# Patient Record
Sex: Female | Born: 2000 | Race: White | Hispanic: Yes | Marital: Single | Smoking: Current every day smoker
Health system: Southern US, Community
[De-identification: ages and names within clinical notes are randomized; demographics above are authoritative.]

## PROBLEM LIST (undated history)

## (undated) DIAGNOSIS — F319 Bipolar disorder, unspecified: Secondary | ICD-10-CM

---

## 2021-08-11 ENCOUNTER — Ambulatory Visit (INDEPENDENT_AMBULATORY_CARE_PROVIDER_SITE_OTHER): Payer: Medicaid Other

## 2021-08-11 ENCOUNTER — Ambulatory Visit
Admission: EM | Admit: 2021-08-11 | Discharge: 2021-08-11 | Disposition: A | Discharge status: Home / Self Care | Payer: Medicaid Other | Attending: Internal Medicine | Admitting: Internal Medicine

## 2021-08-11 ENCOUNTER — Encounter (HOSPITAL_COMMUNITY): Payer: Self-pay

## 2021-08-11 ENCOUNTER — Other Ambulatory Visit: Payer: Self-pay

## 2021-08-11 ENCOUNTER — Encounter: Payer: Self-pay | Admitting: Emergency Medicine

## 2021-08-11 ENCOUNTER — Emergency Department (HOSPITAL_COMMUNITY)
Admission: EM | Admit: 2021-08-11 | Discharge: 2021-08-11 | Disposition: A | Discharge status: Home / Self Care | Payer: Medicaid Other | Attending: Emergency Medicine | Admitting: Emergency Medicine

## 2021-08-11 DIAGNOSIS — M25571 Pain in right ankle and joints of right foot: Secondary | ICD-10-CM | POA: Diagnosis not present

## 2021-08-11 DIAGNOSIS — S92111A Displaced fracture of neck of right talus, initial encounter for closed fracture: Secondary | ICD-10-CM | POA: Diagnosis not present

## 2021-08-11 DIAGNOSIS — M79671 Pain in right foot: Secondary | ICD-10-CM | POA: Diagnosis not present

## 2021-08-11 DIAGNOSIS — Y990 Civilian activity done for income or pay: Secondary | ICD-10-CM | POA: Insufficient documentation

## 2021-08-11 DIAGNOSIS — S92101A Unspecified fracture of right talus, initial encounter for closed fracture: Secondary | ICD-10-CM | POA: Diagnosis not present

## 2021-08-11 DIAGNOSIS — F17210 Nicotine dependence, cigarettes, uncomplicated: Secondary | ICD-10-CM | POA: Insufficient documentation

## 2021-08-11 DIAGNOSIS — W228XXA Striking against or struck by other objects, initial encounter: Secondary | ICD-10-CM | POA: Diagnosis not present

## 2021-08-11 DIAGNOSIS — S99911A Unspecified injury of right ankle, initial encounter: Secondary | ICD-10-CM | POA: Diagnosis present

## 2021-08-11 DIAGNOSIS — S82891A Other fracture of right lower leg, initial encounter for closed fracture: Secondary | ICD-10-CM

## 2021-08-11 MED ORDER — IBUPROFEN 800 MG PO TABS: 800.0000 mg | ORAL_TABLET | Freq: Three times a day (TID) | ORAL | 0 refills | Status: DC

## 2021-08-11 NOTE — ED Provider Notes (Signed)
[facility] EMERGENCY DEPARTMENT Provider Note   CSN: [id number] Arrival date & time: 08/11/21  1316     History No chief complaint on file.   Katrina Shaw is a 20 y.o. female.  HPI  Patient presents with right ankle fracture.  This happened 4 days ago acutely, a flower cart rolled over her foot and caused significant pain.  Pain itself is been constant, not improving.  Was seen in urgent care earlier today and they advised to go to the ED for CT scan to better evaluate the fracture.  She is wearing a boot and using crutches which helped alleviate the pain.  Bearing weight on the foot is an aggravating factor.  No prior injuries to the extremity.  History reviewed. No pertinent past medical history.  There are no problems to display for this patient.   History reviewed. No pertinent surgical history.   OB History   No obstetric history on file.     No family history on file.  Social History   Tobacco Use   Smoking status: Every Day    Types: Cigarettes   Smokeless tobacco: Never    Home Medications Prior to Admission medications   Medication Sig Start Date End Date Taking? Authorizing Provider  ibuprofen (ADVIL) 800 MG tablet Take 1 tablet (800 mg total) by mouth 3 (three) times daily. 08/11/21  Yes Theron Arista, PA-C    Allergies    Patient has no known allergies.  Review of Systems   Review of Systems  Constitutional:  Negative for fever.  Musculoskeletal:  Positive for gait problem and myalgias.   Physical Exam Updated Vital Signs BP 112/89   Pulse 100   Temp 98 F (36.7 C) (Oral)   Resp 14   SpO2 98%   Physical Exam Vitals and nursing note reviewed. Exam conducted with a chaperone present.  Constitutional:      General: She is not in acute distress.    Appearance: Normal appearance.  HENT:     Head: Normocephalic and atraumatic.  Eyes:     General: No scleral icterus.    Extraocular Movements: Extraocular movements  intact.     Pupils: Pupils are equal, round, and reactive to light.  Cardiovascular:     Pulses: Normal pulses.     Comments: Radial pulse DP and PT 2+ Musculoskeletal:        General: Swelling, tenderness and signs of injury present.     Comments: Decreased range of motion to the right ankle secondary to pain.  Good range of motion to the toes, knee.  Compartment is soft.  Skin:    Capillary Refill: Capillary refill takes less than 2 seconds.     Coloration: Skin is not jaundiced.  Neurological:     Mental Status: She is alert. Mental status is at baseline.     Coordination: Coordination normal.     Comments: Sensation to light touch is grossly intact   ED Results / Procedures / Treatments   Labs (all labs ordered are listed, but only abnormal results are displayed) Labs Reviewed - No data to display  EKG None  Radiology DG Ankle Complete Right  Result Date: 08/11/2021 CLINICAL DATA:  Foot and ankle rolled over by large cart by report. EXAM: RIGHT ANKLE - COMPLETE 3+ VIEW COMPARISON:  Foot evaluation of the same date. FINDINGS: Soft tissue swelling centered more about the hindfoot Small bone fragment along the anterior talus. Lateral view is with mildly oblique projection. On  this view there is linear density that passes through the talar neck. IMPRESSION: Question mildly displaced fracture of the talar neck associated with small avulsion injury as well. Lateral view limited by some obliquity. CT may be helpful for further assessment. Extensive soft tissue swelling over the hindfoot. Electronically Signed   By: Donzetta Kohut M.D.   On: 08/11/2021 11:40   DG Foot Complete Right  Result Date: 08/11/2021 CLINICAL DATA:  A 20 year old female presents for evaluation of foot pain that began on Friday after injury to the RIGHT foot and ankle. EXAM: RIGHT FOOT COMPLETE - 3+ VIEW COMPARISON:  Ankle evaluation of the same date. FINDINGS: Some soft tissue swelling over the subtle soft tissue  swelling over the anterior talus on the lateral view and small calcific density better seen on the ankle evaluation of the same date. No additional signs of fracture on the foot radiograph. IMPRESSION: Soft tissue swelling with possible avulsion fracture over the anterior talus. Also with some added density through the talar neck suggested more on the ankle than on the foot radiograph, see ankle radiograph for further detail. Electronically Signed   By: Donzetta Kohut M.D.   On: 08/11/2021 11:34    Procedures Procedures   Medications Ordered in ED Medications - No data to display  ED Course  I have reviewed the triage vital signs and the nursing notes.  Pertinent labs & imaging results that were available during my care of the patient were reviewed by me and considered in my medical decision making (see chart for details).    MDM Rules/Calculators/A&P                           Soft compartments, neurovascularly intact.  Good cap refill, strong pulses.  She is able to flex and extend the toes, no tenderness to the anterior lower extremity.  Do not suspect compartment syndrome, no vascular compromise.  Do not think CT is warranted based on the physical exam.  I also independently reviewed the x-ray results which show a fracture, patient will need orthopedic follow-up.  She is already been given boot and crutches, will give orthopedic referral as well as anti-inflammatory medicine.  Patient discharged in stable condition.  Final Clinical Impression(s) / ED Diagnoses Final diagnoses:  Closed fracture of right ankle, initial encounter    Rx / DC Orders ED Discharge Orders          Ordered    ibuprofen (ADVIL) 800 MG tablet  3 times daily        08/11/21 1500             Theron Arista, [place] 08/11/21 1742    Ernie Avena, MD 08/11/21 1859

## 2021-08-11 NOTE — ED Provider Notes (Signed)
[facility]    CSN: [id number] Arrival date & time: 08/11/21  9937      History   Chief Complaint Chief Complaint  Patient presents with   Foot Injury    HPI Katrina Shaw is a 20 y.o. female.   Patient presents with right ankle and foot injury that occurred approximately 4 days ago.  Patient reports that a cart full of plants rolled over her ankle and foot and did not immediately move off.  Patient has been having swelling and pain in the right foot and ankle since injury occurred.  Patient having difficulty with range of motion of toes and ankle.  Denies any numbness or tingling to the area.  Patient is able to bear weight but with difficulty.   Foot Injury  History reviewed. No pertinent past medical history.  There are no problems to display for this patient.   History reviewed. No pertinent surgical history.  OB History   No obstetric history on file.      Home Medications    Prior to Admission medications   Not on File    Family History History reviewed. No pertinent family history.  Social History Social History   Tobacco Use   Smoking status: Every Day    Types: Cigarettes   Smokeless tobacco: Never     Allergies   Patient has no known allergies.   Review of Systems Review of Systems Per HPI  Physical Exam Triage Vital Signs ED Triage Vitals  Enc Vitals Group     BP 08/11/21 1059 (!) 146/84     Pulse Rate 08/11/21 1059 (!) 133     Resp 08/11/21 1059 16     Temp 08/11/21 1059 97.9 F (36.6 C)     Temp Source 08/11/21 1059 Oral     SpO2 08/11/21 1059 96 %     Weight --      Height --      Head Circumference --      Peak Flow --      Pain Score 08/11/21 1107 5     Pain Loc --      Pain Edu? --      Excl. in GC? --    No data found.  Updated Vital Signs BP (!) 146/84 (BP Location: Left Arm)   Pulse (!) 133   Temp 97.9 F (36.6 C) (Oral)   Resp 16   SpO2 96%   Visual Acuity Right Eye Distance:    Left Eye Distance:   Bilateral Distance:    Right Eye Near:   Left Eye Near:    Bilateral Near:     Physical Exam Constitutional:      General: She is not in acute distress.    Appearance: Normal appearance. She is not toxic-appearing or diaphoretic.  HENT:     Head: Normocephalic and atraumatic.  Eyes:     Extraocular Movements: Extraocular movements intact.     Conjunctiva/sclera: Conjunctivae normal.  Pulmonary:     Effort: Pulmonary effort is normal.  Musculoskeletal:     Right ankle: Swelling present. No deformity. Tenderness present. No lateral malleolus tenderness. Decreased range of motion. Anterior drawer test negative. Normal pulse.     Right Achilles Tendon: Normal.     Left ankle: Normal.     Right foot: Normal capillary refill. Swelling and tenderness present. No deformity, bony tenderness or crepitus. Normal pulse.     Left foot: Normal.       Feet:  Comments: Moderate swelling noted to right lateral ankle.  Erythema and mild swelling noted to left medial foot.  Neurovascular intact.  Patient is able to wiggle toes but is having difficulty with range of motion of toes.  Decreased range of motion of right ankle as well.  Neurological:     General: No focal deficit present.     Mental Status: She is alert and oriented to person, place, and time. Mental status is at baseline.  Psychiatric:        Mood and Affect: Mood normal.        Behavior: Behavior normal.        Thought Content: Thought content normal.        Judgment: Judgment normal.     UC Treatments / Results  Labs (all labs ordered are listed, but only abnormal results are displayed) Labs Reviewed - No data to display  EKG   Radiology DG Ankle Complete Right  Result Date: 08/11/2021 CLINICAL DATA:  Foot and ankle rolled over by large cart by report. EXAM: RIGHT ANKLE - COMPLETE 3+ VIEW COMPARISON:  Foot evaluation of the same date. FINDINGS: Soft tissue swelling centered more about the  hindfoot Small bone fragment along the anterior talus. Lateral view is with mildly oblique projection. On this view there is linear density that passes through the talar neck. IMPRESSION: Question mildly displaced fracture of the talar neck associated with small avulsion injury as well. Lateral view limited by some obliquity. CT may be helpful for further assessment. Extensive soft tissue swelling over the hindfoot. Electronically Signed   By: Donzetta Kohut M.D.   On: 08/11/2021 11:40   DG Foot Complete Right  Result Date: 08/11/2021 CLINICAL DATA:  A 20 year old female presents for evaluation of foot pain that began on Friday after injury to the RIGHT foot and ankle. EXAM: RIGHT FOOT COMPLETE - 3+ VIEW COMPARISON:  Ankle evaluation of the same date. FINDINGS: Some soft tissue swelling over the subtle soft tissue swelling over the anterior talus on the lateral view and small calcific density better seen on the ankle evaluation of the same date. No additional signs of fracture on the foot radiograph. IMPRESSION: Soft tissue swelling with possible avulsion fracture over the anterior talus. Also with some added density through the talar neck suggested more on the ankle than on the foot radiograph, see ankle radiograph for further detail. Electronically Signed   By: Donzetta Kohut M.D.   On: 08/11/2021 11:34    Procedures Procedures (including critical care time)  Medications Ordered in UC Medications - No data to display  Initial Impression / Assessment and Plan / UC Course  I have reviewed the triage vital signs and the nursing notes.  Pertinent labs & imaging results that were available during my care of the patient were reviewed by me and considered in my medical decision making (see chart for details).     It appears that patient has an avulsion fracture of the talar neck of the right lower extremity.  Radiologist suggested CT scan for further evaluation given limited view.  Nonweightbearing  boot applied in urgent care and crutches were supplied for nonweightbearing.  No splinting supplies available in urgent care.  Provider at hospital may change splint if needed based on further imaging.  Patient advised to go to the hospital for CT scan and further evaluation.  Patient was agreeable with plan.  She will need to follow-up with orthopedist in the next few days if no other abnormalities  found at the hospital.  Provided patient with contact information for orthopedist.  Discussed supportive care and ice application with patient.  Vital signs stable at discharge.  Agree with patient's family member transporting her to the hospital. Final Clinical Impressions(s) / UC Diagnoses   Final diagnoses:  Closed displaced fracture of neck of right talus, initial encounter     Discharge Instructions      Please go to the emergency department as soon as you leave urgent care for further evaluation and management of your right ankle fracture.  It is suggested that you get a CT scan for further evaluation.  You will need to follow-up with orthopedist in the next few days unless otherwise advised by emergency department for further evaluation and management.  No weightbearing until otherwise advised.    ED Prescriptions   None    PDMP not reviewed this encounter.   Gustavus Bryant, [place] 08/11/21 1221

## 2021-08-11 NOTE — Discharge Instructions (Addendum)
Take 800 mg of ibuprofen 3 times daily.  Take it with food and water when you can as anti-inflammatory medicine irritates his stomach lining.  Continue wearing the boot and using the crutches, do not bear weight on the ankle.  Call the orthopedic office today to set up follow-up this week.

## 2021-08-11 NOTE — Discharge Instructions (Signed)
Please go to the emergency department as soon as you leave urgent care for further evaluation and management of your right ankle fracture.  It is suggested that you get a CT scan for further evaluation.  You will need to follow-up with orthopedist in the next few days unless otherwise advised by emergency department for further evaluation and management.  No weightbearing until otherwise advised.

## 2021-08-11 NOTE — ED Triage Notes (Signed)
Was rolling a large cart of plants at work on Friday and rolled over right ankle/foot, it was then stuck on the foot and not immediately moved off. Now having swelling and pain in the right foot with difficulty moving toes, foot, and ankle.

## 2021-08-11 NOTE — ED Triage Notes (Signed)
Patient from urgent care following accident at work on Friday. States that her foot was run over by piece of equipment. Patient was told she has fracture

## 2021-11-14 ENCOUNTER — Ambulatory Visit (HOSPITAL_COMMUNITY)
Admission: EM | Admit: 2021-11-14 | Discharge: 2021-11-14 | Disposition: A | Discharge status: Home / Self Care | Payer: Medicaid Other | Attending: Urology | Admitting: Urology

## 2021-11-14 DIAGNOSIS — R45851 Suicidal ideations: Secondary | ICD-10-CM | POA: Insufficient documentation

## 2021-11-14 DIAGNOSIS — F332 Major depressive disorder, recurrent severe without psychotic features: Secondary | ICD-10-CM

## 2021-11-14 DIAGNOSIS — F319 Bipolar disorder, unspecified: Secondary | ICD-10-CM | POA: Insufficient documentation

## 2021-11-14 NOTE — ED Triage Notes (Addendum)
Pt presents to [facility] requesting resources for a psychiatrist. Pt states that she was diagnosed with Bipolar and depression and is not presribed any medication at this time. Pt states that she had thoughts about wanting to kill herself by cutting her wrist on February 12th, she was in the woods, but she did not go through with it because one of her friends showed up and stopped her. Pt states that she had passive SI this week but no intent " I am just overwhelmed with life and don't want to be here anymore". Pt states that she saw her PCP today and made a comment that she needed to find a new psychiatrist and when asked why she told her PCP about the incident and the PCP recommended that she come here for an evaluation. Pt states that she has never been hospitalized before. Pt states that she plans on staying the night with a friend tonight so she won't be alone. Pts friend Tiffany Kocher can be reached at [phone]). Pt denies SI at this time. Pt denies HI and AVH. ?
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
## 2021-11-14 NOTE — ED Provider Notes (Signed)
Behavioral Health Urgent Care Medical Screening Exam ? ?Patient Name: Katrina Shaw ?MRN: [id number] ?Date of Evaluation: 11/14/21 ?Chief Complaint:   ?Diagnosis:  ?Final diagnoses:  ?Severe episode of recurrent major depressive disorder, without psychotic features (HCC)  ? ? ?History of Present illness: Katrina Shaw is a 21 y.o. female with self reported psychiatric history of bipolar disorder, depression, and suicidal ideation.  Patient presented voluntarily to [facility] requesting outpatient resources for a psychiatric provider. ? ?Patient was seen face-to-face and her chart was reviewed by this nurse practitioner. on ?evaluation, patient is alert and oriented x4.  Patient is calm and cooperative.  She is speaking in a normal tone of voice at moderate rate with good eye contact. Patient's mood is euthymic with congruent affect.  Patient's thought process is coherent.  Patient did not appear to be responding to any internal/external stimuli or experiencing any delusional thought content during this assessment. ? ?Patient reports that she is diagnosed with bipolar, depression, and suicidal ideation.  Patient reports that she currently does not have a psychiatrist. She says she last saw a psychiatrist, Dr. Ezzard Standing in Summer of 2022. She is active with a therapist, Verdene Lennert at [facility] and she last saw her therapist on 10/15/21. She reports she is not taking any psychotropics because "nothing works." She reports that she has tried several medications (Seroquel, Atarax, Lexapro, Wellbutrin) in the past but they were ineffective. She says she wants to establish care with a psychiatrist so explore treatment options.  ? ?She denies suicidal ideations; she verbally contracted for safety with this Clinical research associate. She admits to passive SI with no plan/intent earlier this week. She reports that on 10/26/21 she was having suicidal ideation and engaged in self-harming by "cutting with a dull knife." She  is noted with light scar marks to left wrist. She admits at suicidal attempt by overdose at age 60 years old.  ? ?Patient gave verbal consent for writer to contact her friend Georgeanne Nim [phone] for collateral. ?This Clinical research associate spoke to Ms. [place] who reports that patient made suicidal statement of "I want to die" about 2-3 weeks ago. She reports that she urged patient to seek mental health treatment and patient has been "working on her mental health and improving." She reports that patient will be staying with her tonight. She denies any safety concerns.  ? ? ? ?Psychiatric Specialty Exam ? ?Presentation  ?General Appearance:Appropriate for Environment ? ?Eye Contact:Good ? ?Speech:Clear and Coherent ? ?Speech Volume:Normal ? ?Handedness:Right ? ? ?Mood and Affect  ?Mood:Depressed ? ?Affect:Congruent ? ? ?Thought Process  ?Thought Processes:Coherent ? ?Descriptions of Associations:Intact ? ?Orientation:Full (Time, Place and Person) ? ?Thought Content:Logical ? Diagnosis of Schizophrenia or Schizoaffective disorder in past: No ?  Hallucinations:None ? ?Ideas of Reference:None ? ?Suicidal Thoughts:No ? ?Homicidal Thoughts:No ? ? ?Sensorium  ?Memory:Immediate Good; Recent Good; Remote Good ? ?Judgment:Good ? ?Insight:Good ? ? ?Executive Functions  ?Concentration:Good ? ?Attention Span:Good ? ?Recall:Good ? ?Fund of Knowledge:Good ? ?Language:Good ? ? ?Psychomotor Activity  ?Psychomotor Activity:Normal ? ? ?Assets  ?Assets:Communication Skills; Desire for Improvement; Housing; Physical Health; Social Support; Transportation ? ? ?Sleep  ?Sleep:Fair ? ?Number of hours: No data recorded ? ?No data recorded ? ?Physical Exam: ?Physical Exam ?Vitals and nursing note reviewed.  ?Constitutional:   ?   General: She is not in acute distress. ?   Appearance: She is well-developed.  ?HENT:  ?   Head: Normocephalic and atraumatic.  ?   Nose: No congestion or rhinorrhea.  ?  Eyes:  ?   Conjunctiva/sclera: Conjunctivae  normal.  ?Cardiovascular:  ?   Rate and Rhythm: Normal rate.  ?Pulmonary:  ?   Effort: Pulmonary effort is normal. No respiratory distress.  ?   Breath sounds: Normal breath sounds.  ?Abdominal:  ?   Palpations: Abdomen is soft.  ?   Tenderness: There is no abdominal tenderness.  ?Musculoskeletal:     ?   General: No swelling.  ?   Cervical back: Neck supple.  ?Skin: ?   General: Skin is warm and dry.  ?   Capillary Refill: Capillary refill takes less than 2 seconds.  ?Neurological:  ?   Mental Status: She is alert and oriented to person, place, and time.  ?Psychiatric:     ?   Attention and Perception: Attention and perception normal.     ?   Mood and Affect: Mood is anxious.     ?   Speech: Speech normal.     ?   Behavior: Behavior normal. Behavior is cooperative.     ?   Thought Content: Thought content normal.     ?   Cognition and Memory: Cognition normal.  ? ?Review of Systems  ?Constitutional: Negative.   ?HENT: Negative.    ?Eyes: Negative.   ?Respiratory: Negative.    ?Cardiovascular: Negative.   ?Gastrointestinal: Negative.   ?Genitourinary: Negative.   ?Musculoskeletal: Negative.   ?Skin: Negative.   ?Neurological: Negative.   ?Endo/Heme/Allergies: Negative.   ?Psychiatric/Behavioral:  The patient is nervous/anxious.   ?Blood pressure (!) 147/107, pulse 89, temperature 98.8 ?F (37.1 ?C), temperature source Oral, resp. rate 18, SpO2 99 %. There is no height or weight on file to calculate BMI. ? ?Musculoskeletal: ?Strength & Muscle Tone: within normal limits ?Gait & Station: normal ?Patient leans: Right ? ? ?[facility] M[facility] Discharge Disposition for Follow up and Recommendations: ?Based on my evaluation the patient does not appear to have an emergency medical condition and can be discharged with resources and follow up care in outpatient services for Individual Therapy and patient was given resources for outpatient provider. She was also given information about Open Access here at [facility]. ?-patient doesn't want to  be started on any psychotropic at this time. She wants to establish care with a psychiatrist and explore treatment options.  ? ?Maricela Bo, NP ?11/14/2021, 8:08 PM ? ?

## 2021-11-14 NOTE — Discharge Instructions (Addendum)
You are encouraged to follow up with [facility] for outpatient treatment. ? ?Walk in/ Open Access Hours: ?Monday - Friday 8AM - 11AM (To see provider and therapist) - Arrive around 7 or 7:15 to have a better chance of being seen, as slots fill up.   ?Friday - 1PM - 4PM (To see therapist only) ? ?[facility] ?[address] ?[place], [place] ?[phone] ? ? ?Discharge recommendations:  ?Patient is to take medications as prescribed. ?Please see information for follow-up appointment with psychiatry and therapy. ?Please follow up with your primary care provider for all medical related needs.  ? ?Therapy: We recommend that patient participate in individual therapy to address mental health concerns. ? ?Medications: The parent/guardian is to contact a medical professional and/or outpatient provider to address any new side effects that develop. Parent/guardian should update outpatient providers of any new medications and/or medication changes.  ? ?Atypical antipsychotics: If you are prescribed an atypical antipsychotic, it is recommended that your height, weight, BMI, blood pressure, fasting lipid panel, and fasting blood sugar be monitored by your outpatient providers. ? ?Safety:  ?The patient should abstain from use of illicit substances/drugs and abuse of any medications. ?If symptoms worsen or do not continue to improve or if the patient becomes actively suicidal or homicidal then it is recommended that the patient return to the closest hospital emergency department, the [facility], or call 911 for further evaluation and treatment. ?National Suicide Prevention Lifeline 1-800-SUICIDE or 1-800-273-8255. ? ?About 988 ?988 offers 24/7 access to trained crisis counselors who can help people experiencing mental health-related distress. People can call or text 988 or chat 988lifeline.org for themselves or if they are worried about a loved one who may need  crisis support. ? ?

## 2021-11-26 ENCOUNTER — Telehealth (HOSPITAL_COMMUNITY): Payer: Self-pay | Admitting: Family Medicine

## 2022-01-22 ENCOUNTER — Ambulatory Visit (HOSPITAL_COMMUNITY): Payer: Medicaid Other | Admitting: Student in an Organized Health Care Education/Training Program

## 2022-11-05 ENCOUNTER — Telehealth: Payer: Self-pay

## 2022-11-05 NOTE — Telephone Encounter (Signed)
Received referral for patient from [facility] but visit notes state pt is already schedule for a sleep consult with another office. Reached out to office to clarify what referral was for on 1/31 but have not heard anything. Pt declined from our office at this time.

## 2022-12-07 LAB — CYTOLOGY - PAP
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
## 2023-04-08 ENCOUNTER — Ambulatory Visit
Admission: EM | Admit: 2023-04-08 | Discharge: 2023-04-08 | Disposition: A | Discharge status: Home / Self Care | Payer: Medicaid Other

## 2023-04-08 DIAGNOSIS — K644 Residual hemorrhoidal skin tags: Secondary | ICD-10-CM | POA: Diagnosis not present

## 2023-04-08 MED ORDER — HYDROCORTISONE (PERIANAL) 2.5 % EX CREA: 1.0000 | TOPICAL_CREAM | Freq: Two times a day (BID) | CUTANEOUS | 0 refills | Status: AC

## 2023-04-08 NOTE — ED Triage Notes (Signed)
Pt c/o having BRB after having a BM x4 days. States sees in when she wipes and drips in toilet. Denies pain.

## 2023-04-08 NOTE — ED Provider Notes (Signed)
[facility]    CSN: [id number] Arrival date & time: 04/08/23  1629      History   Chief Complaint Chief Complaint  Patient presents with   Rectal Bleeding    HPI Katrina Shaw is a 22 y.o. female.   Patient here today for evaluation of bright red blood after having a bowel movement for the last 4 days.  She reports that stool has been relatively soft.  She denies any frequent bowel movements.  She notes that she sees blood when she wipes and small drops in the toilet.  She denies any rectal pain.  She denies any vomiting or abdominal pain.  The history is provided by the patient.    History reviewed. No pertinent past medical history.  There are no problems to display for this patient.   History reviewed. No pertinent surgical history.  OB History   No obstetric history on file.      Home Medications    Prior to Admission medications   Medication Sig Start Date End Date Taking? Authorizing Provider  eszopiclone (LUNESTA) 1 MG TABS tablet Take 1 mg by mouth at bedtime. 02/16/23  Yes [provider]  hydrocortisone (ANUSOL-HC) 2.5 % rectal cream Place 1 Application rectally 2 (two) times daily. 04/08/23  Yes Tomi Bamberger, PA-C  ramelteon (ROZEREM) 8 MG tablet Take by mouth. 03/05/23 03/04/24 Yes [provider]  ibuprofen (ADVIL) 800 MG tablet Take 1 tablet (800 mg total) by mouth 3 (three) times daily. 08/11/21   Theron Arista, PA-C  lamoTRIgine (LAMICTAL) 150 MG tablet Take 150 mg by mouth 2 (two) times daily.    [provider]  REXULTI 1 MG TABS tablet Take 1 mg by mouth daily.    [provider]    Family History History reviewed. No pertinent family history.  Social History Social History   Tobacco Use   Smoking status: Every Day    Types: Cigarettes   Smokeless tobacco: Never  Substance Use Topics   Alcohol use: Yes   Drug use: Not Currently     Allergies   Patient has no known  allergies.   Review of Systems Review of Systems  Constitutional:  Negative for chills and fever.  Eyes:  Negative for discharge and redness.  Gastrointestinal:  Positive for anal bleeding. Negative for abdominal pain, constipation, diarrhea, nausea and vomiting.     Physical Exam Triage Vital Signs ED Triage Vitals  Encounter Vitals Group     BP      Systolic BP Percentile      Diastolic BP Percentile      Pulse      Resp      Temp      Temp src      SpO2      Weight      Height      Head Circumference      Peak Flow      Pain Score      Pain Loc      Pain Education      Exclude from Growth Chart    No data found.  Updated Vital Signs BP (!) 151/81 (BP Location: Left Arm)   Pulse 75   Temp 98.6 F (37 C) (Oral)   Resp 18   SpO2 98%       Physical Exam Vitals and nursing note reviewed.  Constitutional:      General: She is not in acute distress.    Appearance:  Normal appearance. She is not ill-appearing.  HENT:     Head: Normocephalic and atraumatic.  Eyes:     Conjunctiva/sclera: Conjunctivae normal.  Cardiovascular:     Rate and Rhythm: Normal rate.  Pulmonary:     Effort: Pulmonary effort is normal. No respiratory distress.  Genitourinary:    Comments: Small external hemorrhoid noted on exam- non thrombosed- no bleeding appreciated Neurological:     Mental Status: She is alert.  Psychiatric:        Mood and Affect: Mood normal.        Behavior: Behavior normal.        Thought Content: Thought content normal.      UC Treatments / Results  Labs (all labs ordered are listed, but only abnormal results are displayed) Labs Reviewed - No data to display  EKG   Radiology No results found.  Procedures Procedures (including critical care time)  Medications Ordered in UC Medications - No data to display  Initial Impression / Assessment and Plan / UC Course  I have reviewed the triage vital signs and the nursing notes.  Pertinent labs &  imaging results that were available during my care of the patient were reviewed by me and considered in my medical decision making (see chart for details).    Steroid cream prescribed for hemorrhoid treatment.  Discussed bathroom hygiene including limiting time on toilet and avoiding constipation and encouraged follow-up if no gradual improvement or with any further concerns.  Final Clinical Impressions(s) / UC Diagnoses   Final diagnoses:  External hemorrhoid   Discharge Instructions   None    ED Prescriptions     Medication Sig Dispense Auth. Provider   hydrocortisone (ANUSOL-HC) 2.5 % rectal cream Place 1 Application rectally 2 (two) times daily. 30 g Tomi Bamberger, PA-C      PDMP not reviewed this encounter.   Tomi Bamberger, PA-C 04/08/23 Windell Moment

## 2023-06-17 ENCOUNTER — Ambulatory Visit (INDEPENDENT_AMBULATORY_CARE_PROVIDER_SITE_OTHER): Payer: Medicaid Other | Admitting: Student

## 2023-06-17 DIAGNOSIS — F5104 Psychophysiologic insomnia: Secondary | ICD-10-CM | POA: Diagnosis not present

## 2023-06-17 DIAGNOSIS — F331 Major depressive disorder, recurrent, moderate: Secondary | ICD-10-CM | POA: Diagnosis not present

## 2023-06-17 DIAGNOSIS — F411 Generalized anxiety disorder: Secondary | ICD-10-CM

## 2023-06-17 MED ORDER — TRAZODONE HCL 50 MG PO TABS
ORAL_TABLET | ORAL | 2 refills | Status: DC
Start: 2023-06-17 — End: 2023-08-10

## 2023-06-17 MED ORDER — VORTIOXETINE HBR 5 MG PO TABS: ORAL_TABLET | ORAL | 1 refills | Status: DC
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
## 2023-06-21 DIAGNOSIS — F331 Major depressive disorder, recurrent, moderate: Secondary | ICD-10-CM | POA: Insufficient documentation

## 2023-06-21 DIAGNOSIS — F5104 Psychophysiologic insomnia: Secondary | ICD-10-CM | POA: Insufficient documentation

## 2023-06-21 DIAGNOSIS — F411 Generalized anxiety disorder: Secondary | ICD-10-CM | POA: Insufficient documentation

## 2023-06-29 ENCOUNTER — Ambulatory Visit (INDEPENDENT_AMBULATORY_CARE_PROVIDER_SITE_OTHER): Payer: Medicaid Other | Admitting: Clinical

## 2023-06-29 DIAGNOSIS — F331 Major depressive disorder, recurrent, moderate: Secondary | ICD-10-CM
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
## 2023-07-15 ENCOUNTER — Encounter (HOSPITAL_COMMUNITY): Payer: Medicaid Other | Admitting: Student

## 2023-07-27 ENCOUNTER — Ambulatory Visit (INDEPENDENT_AMBULATORY_CARE_PROVIDER_SITE_OTHER): Payer: Medicaid Other | Admitting: Clinical

## 2023-07-27 DIAGNOSIS — F331 Major depressive disorder, recurrent, moderate: Secondary | ICD-10-CM

## 2023-08-08 NOTE — Progress Notes (Signed)
Katrina Shaw is a 22 y.o. female patient presented oriented times five and friendly. Client reported on today she thought the appointment was for medication management. 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 Client reported no concerns at this time. Client will be scheduled for follow up appointments.     Collaboration of Care: Patient refused AEB none requested.  Patient/Guardian was advised Release of Information must be obtained prior to any record release in order to collaborate their care with an outside provider. Patient/Guardian was advised if they have not already done so to contact the registration department to sign all necessary forms in order for [place] to release information regarding their care.   Consent: Patient/Guardian gives verbal consent for treatment and assignment of benefits for services provided during this visit. Patient/Guardian expressed understanding and agreed to proceed.    Neena Rhymes Lochlin Eppinger, LCSW

## 2023-08-10 ENCOUNTER — Telehealth (HOSPITAL_COMMUNITY): Payer: Medicaid Other | Admitting: Student

## 2023-08-10 DIAGNOSIS — F411 Generalized anxiety disorder: Secondary | ICD-10-CM | POA: Diagnosis not present

## 2023-08-10 DIAGNOSIS — F331 Major depressive disorder, recurrent, moderate: Secondary | ICD-10-CM | POA: Diagnosis not present

## 2023-08-10 MED ORDER — VORTIOXETINE HBR 10 MG PO TABS: 10.0000 mg | ORAL_TABLET | Freq: Every day | ORAL | 2 refills | Status: AC

## 2023-08-11 NOTE — Progress Notes (Addendum)
BH MD Outpatient Progress Note  Date of visit: 08/10/2023 Katrina Shaw  MRN:  [id number]  Assessment:  Katrina Shaw presents for follow-up evaluation.  At the last visit, the patient was started on Trintellix 5 mg daily, with the plan to increase to 10 mg daily after 7 days in order to address depressive and anxious symptoms..  The patient was also prescribed trazodone to help with sleep.  The patient feels that the trazodone had an intolerable side effect of morning sedation.  The patient reports that she did not remember to begin taking the 10 mg dose of Trintellix.  She feels that her mood is relatively the same, but that her anxiety may be slightly less and she notes that she is engaging in productive behaviors.  Plan to have the patient increase the dose of Trintellix to 10 mg daily and reassess in 2 months.  Identifying Information: Katrina Shaw is a 22 y.o. y.o. female with a history of MDD, GAD, and PTSD who is an established patient with [facility] for management of depression and anxiety.   Plan:  # Major depressive disorder  generalized anxiety disorder  PTSD  insomnia Past medication trials: Previous medication regimen of lamotrigine 150 mg twice daily, Trintellix 5 mg daily, and Rexulti 1 mg daily, which the patient stopped approximately 1 month before this initial appointment. Interventions: -- Increase Trintellix from 5 mg to 10 mg daily - Discontinue trazodone due to side effect - Continue therapy with Paige  Patient was given contact information for behavioral health clinic and was instructed to call 911 for emergencies.   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  She reports that she has also gone hunting with her significant other and found this enjoyable.  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  She reports prior to the issues that she has car problems.  The patient does not feel that her mood has improved.  She states that her predominant mood state is depressed.  Despite this, the patient reports that her sleep may be somewhat improved, even without trazodone.  She says that she is sleeping with fewer interruptions during the night.  She reports appropriate appetite.  She denies experiencing any hopelessness or suicidal thoughts.  Her affect is noted to be appropriate, displaying positive emotion.  The patient denies experiencing significant anxiety.  She denies experiencing any panic attacks.  She denies any recurrence of self-injurious behavior.  She denies consuming any alcohol or smoking any marijuana.  Visit Diagnosis:    ICD-10-CM   1. Major depressive disorder, recurrent episode, moderate (HCC)  F33.1 vortioxetine HBr (TRINTELLIX) 10 MG TABS tablet    2. Generalized anxiety disorder  F41.1 vortioxetine HBr (TRINTELLIX) 10 MG TABS tablet      Past Psychiatric History:  Patient denies prior history of psychiatric admission.  Congruent with review of the medical record.  Patient reports a suicide attempt 1.5 years ago via overdose.  She did not seek medical attention.  The patient also reports a history of self-harm behavior via cutting, most recently occurred 1 year ago.  She reports that she is taking 1 class at the local community college. Previous medication trials: Buspar, Klonopin, Seroquel, Lexapro, Latuda, Prazosin, Abilify, clonidine, doxepin, Vraylar   Past Medical History: No  past medical history on file. No past surgical history on file.  Family Psychiatric History: none pertinent  Family History: No family history on file.  Social History:  Social History   Socioeconomic History   Marital status: Single    Spouse name: Not on file   Number of children: Not on file   Years of education: Not on file    Highest education level: Not on file  Occupational History   Not on file  Tobacco Use   Smoking status: Every Day    Types: Cigarettes   Smokeless tobacco: Never  Substance and Sexual Activity   Alcohol use: Yes   Drug use: Not Currently   Sexual activity: Not on file  Other Topics Concern   Not on file  Social History Narrative   Not on file   Social Determinants of Health   Financial Resource Strain: Not at Risk (05/12/2023)   Received from [employer]    Financial Resource Strain: 1  Food Insecurity: Not at Risk (05/12/2023)   Received from [facility] Insecurity    Food: 1  Transportation Needs: Not at Risk (05/12/2023)   Received from [employer] Needs    Transportation: 1  Physical Activity: Not on File (01/01/2022)   Received from [place], [place]   Physical Activity    Physical Activity: 0  Stress: Not on File (01/01/2022)   Received from [facility], [place]   Stress    Stress: 0  Social Connections: Not on File (05/18/2023)   Received from [facility]   Social Connections    Connectedness: 0    Allergies: No Known Allergies  Current Medications: Current Outpatient Medications  Medication Sig Dispense Refill   vortioxetine HBr (TRINTELLIX) 10 MG TABS tablet Take 1 tablet (10 mg total) by mouth daily. 30 tablet 2   hydrocortisone (ANUSOL-HC) 2.5 % rectal cream Place 1 Application rectally 2 (two) times daily. 30 g 0   ibuprofen (ADVIL) 800 MG tablet Take 1 tablet (800 mg total) by mouth 3 (three) times daily. 21 tablet 0   No current facility-administered medications for this visit.     Objective:  Psychiatric Specialty Exam: Physical Exam Constitutional:      Appearance: the patient is not toxic-appearing.  Pulmonary:     Effort: Pulmonary effort is normal.  Neurological:     General: No focal deficit present.     Mental Status: the patient is alert and oriented to person, place, and time.   Review of Systems  Respiratory:   Negative for shortness of breath.   Cardiovascular:  Negative for chest pain.  Gastrointestinal:  Negative for abdominal pain, constipation, diarrhea, nausea and vomiting.  Neurological:  Negative for headaches.      There were no vitals taken for this visit.  General Appearance: Fairly Groomed  Eye Contact:  Good  Speech:  Clear and Coherent  Volume:  Normal  Mood: Depressed  Affect: Incongruent, affect is appropriate  Thought Process:  Coherent  Orientation:  Full (Time, Place, and Person)  Thought Content: Logical   Suicidal Thoughts:  No  Homicidal Thoughts:  No  Memory:  Immediate;   Good  Judgement:  fair  Insight:  fair  Psychomotor Activity:  Normal  Concentration:  Concentration: Good  Recall:  Good  Fund of Knowledge: Good  Language: Good  Akathisia:  No  Handed:    AIMS (if indicated): not done  Assets:  Communication Skills Desire for Improvement Financial  Resources/Insurance Housing Leisure Time Physical Health  ADL's:  Intact  Cognition: WNL  Sleep:  Fair     Metabolic Disorder Labs: No results found for: "HGBA1C", "MPG" No results found for: "PROLACTIN" No results found for: "CHOL", "TRIG", "HDL", "CHOLHDL", "VLDL", "LDLCALC" No results found for: "TSH"  Therapeutic Level Labs: No results found for: "LITHIUM" No results found for: "VALPROATE" No results found for: "CBMZ"  Screenings: GAD-7    Flowsheet Row Counselor from 06/29/2023 in [facility]  Total GAD-7 Score 16      PHQ2-9    Flowsheet Row Counselor from 06/29/2023 in [facility]  PHQ-2 Total Score 2  PHQ-9 Total Score 11      Flowsheet Row ED from [facility]/08/2023 in [facility]) ED from 08/11/2021 in [facility] at [facility])  C-SSRS RISK CATEGORY No Risk No Risk       Collaboration of Care: none  Virtual Visit via Video Note  I connected with  Katrina Shaw on 08/10/23 at  8:30 AM EST by a video enabled telemedicine application and verified that I am speaking with the correct person using two identifiers.  Location: Patient: home address in [place] Provider: clinic   I discussed the limitations of evaluation and management by telemedicine and the availability of in person appointments. The patient expressed understanding and agreed to proceed.  I discussed the assessment and treatment plan with the patient. The patient was provided an opportunity to ask questions and all were answered. The patient agreed with the plan and demonstrated an understanding of the instructions.   The patient was advised to call back or seek an in-person evaluation if the symptoms worsen or if the condition fails to improve as anticipated.  I provided 35 minutes dedicated to the care of this patient via video on the date of this encounter to include chart review, face-to-face time with the patient, medication management/counseling.  Carlyn Reichert, MD 08/11/2023, 8:45 AM   Will proceed with previous plan to titrate Trintellix to more therapeutic starting dose.  Patient identifies improvement in chronic sleep issues; reviewed sleep hygiene and appears that perhaps decreased time on phone before bedtime has contributed to this improvement and will continue to promote behavioral interventions for sleep protection.   I saw and evaluated the patient. I discussed the case with the resident and agree with the findings and plan as documented in the resident's note and above addendum.    Daine Gip, MD 08/11/23

## 2023-08-17 ENCOUNTER — Ambulatory Visit (INDEPENDENT_AMBULATORY_CARE_PROVIDER_SITE_OTHER): Payer: Medicaid Other | Admitting: Clinical

## 2023-08-17 DIAGNOSIS — F331 Major depressive disorder, recurrent, moderate: Secondary | ICD-10-CM | POA: Diagnosis not present

## 2023-08-17 NOTE — Progress Notes (Signed)
THERAPIST PROGRESS NOTE Virtual Visit via Video Note  I connected with Katrina Shaw on 08/17/23 at  2:00 PM EST by a video enabled telemedicine application and verified that I am speaking with the correct person using two identifiers.  Location: Patient: home Provider: office   I discussed the limitations of evaluation and management by telemedicine and the availability of in person appointments. The patient expressed understanding and agreed to proceed.   Follow Up Instructions: I discussed the assessment and treatment plan with the patient. The patient was provided an opportunity to ask questions and all were answered. The patient agreed with the plan and demonstrated an understanding of the instructions.   The patient was advised to call back or seek an in-person evaluation if the symptoms worsen or if the condition fails to improve as anticipated.   Session Time: 20 minutes  Participation Level: Active  Behavioral Response: CasualAlertEuthymic  Type of Therapy: Individual Therapy  Treatment Goals addressed: client will participate in at least 80% of scheduled individual psychotherapy sessions  ProgressTowards Goals: Progressing  Interventions: CBT and Supportive  Summary:  Katrina Shaw is a 22 y.o. female who presents for the scheduled appointment oriented x 5, appropriately dressed, and friendly.  Client denied hallucinations and delusions. Client reported on today she is doing well.  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  Client reported she is nervous but she is mostly excited about having her first adult job.  Client reported her family was happy for her but they did not have a big celebration following the passing of her exam.   Client reported she is keeping a levelheaded about being continued with wherever she ends up for her first job.  Client reported otherwise she has some upcoming exams for school that she has some stress about studying for.  Client reported overall she is doing pretty well at this time. Evidence of progress towards goal: Client reported to goal achievement of accomplishing her stay certification as well as applying to jobs in her field.  Suicidal/Homicidal: Nowithout intent/plan  Therapist Response:  Therapist began the appointment asking the client how she has been doing since last seen. Therapist used CBT to engage using active listening and positive emotional support. Therapist used CBT to ask the client about changes that have occurred. Therapist used CBT to engage the client to positively acknowledge her accomplishments as well as discussing keeping a realistic and optimistic view on how to manage stress and upcoming job opportunities. Therapist used CBT ask the client to identify her progress with frequency of use with coping skills with continued practice in her daily activity.    Therapist sent client homework to practice self-care.   Plan: Return again in 4 weeks.  Diagnosis: major depressive disorder, recurrent episode, moderate  Collaboration of Care: Patient refused AEB none requested by the client.  Patient/Guardian was advised Release of Information must be obtained prior to any record release in order to collaborate their care with an outside provider. Patient/Guardian was advised if they have not already done so to contact the registration department to sign all necessary forms in order for [place] to release information regarding their care.   Consent: Patient/Guardian gives verbal consent for treatment and assignment of benefits for  services provided during this visit. Patient/Guardian expressed understanding and agreed to proceed.   Neena Rhymes Marnell Mcdaniel, LCSW 08/17/2023

## 2023-09-23 ENCOUNTER — Ambulatory Visit (INDEPENDENT_AMBULATORY_CARE_PROVIDER_SITE_OTHER): Payer: Medicaid Other | Admitting: Clinical

## 2023-09-23 DIAGNOSIS — F331 Major depressive disorder, recurrent, moderate: Secondary | ICD-10-CM | POA: Diagnosis not present

## 2023-09-25 NOTE — Progress Notes (Signed)
   THERAPIST PROGRESS NOTE Virtual Visit via Video Note  I connected with Katrina Shaw on 09/23/2023 at  9:00 AM EST by a video enabled telemedicine application and verified that I am speaking with the correct person using two identifiers.  Location: Patient: home Provider: office   I discussed the limitations of evaluation and management by telemedicine and the availability of in person appointments. The patient expressed understanding and agreed to proceed.   Follow Up Instructions: I discussed the assessment and treatment plan with the patient. The patient was provided an opportunity to ask questions and all were answered. The patient agreed with the plan and demonstrated an understanding of the instructions.   The patient was advised to call back or seek an in-person evaluation if the symptoms worsen or if the condition fails to improve as anticipated.   Session Time: 30 minutes  Participation Level: Active  Behavioral Response: CasualAlertEuthymic  Type of Therapy: Individual Therapy  Treatment Goals addressed: Katrina Shaw will practice behavioral activation skills 3 times per week for the next 12 weeks  ProgressTowards Goals: Progressing  Interventions: CBT  Summary:  Katrina Shaw is a 23 y.o. female who presents for the scheduled appointment oriented x 5, appropriately dressed, and friendly.  Client denied hallucinations and delusions. Client reported on today she is doing pretty well.  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  Client reported she has been going through orientation.  Client reported she will also use starting back her next school semester.  Client reported otherwise she has been slightly stressed about the morning off relationship with her boyfriend who she is back together with.  Client reported she has not yet told her family.  Client reported otherwise she thinks that her medications need  adjustment.  Client reported she has no suicidal ideations however she does continue to experience mood swings with depression.  Client reported she has been noticing that routine is helpful with managing her mood. Evidence of progress towards goal: Client reported medication compliant 7 days a week.  Client reported 1 positive of accomplishing her employment daily.  Suicidal/Homicidal: Nowithout intent/plan  Therapist Response:  Therapist began the appointment asking client how she has been doing since last seen. Therapist used CBT to engage with active listening and positive emotional support. Therapist used CBT to give the client time to discuss changes that have occurred. Therapist used positive reinforcement for the clients goal accomplishment. Therapist used CBT to reinforce the benefits of routine. Therapist used CBT ask the client to identify her progress with frequency of use with coping skills with continued practice in her daily activity.    Therapist assigned client homework to practice self-care.   Plan: Return again in 4 weeks.  Diagnosis: major depressive disorder, recurrent episode, moderate  Collaboration of Care: Patient refused AEB none requested by the client.  Patient/Guardian was advised Release of Information must be obtained prior to any record release in order to collaborate their care with an outside provider. Patient/Guardian was advised if they have not already done so to contact the registration department to sign all necessary forms in order for us  to release information regarding their care.   Consent: Patient/Guardian gives verbal consent for treatment and assignment of benefits for services provided during this visit. Patient/Guardian expressed understanding and agreed to proceed.   Ivania Teagarden Y Marianny Goris, LCSW 09/23/2023

## 2023-10-05 ENCOUNTER — Ambulatory Visit (INDEPENDENT_AMBULATORY_CARE_PROVIDER_SITE_OTHER): Payer: Medicaid Other | Admitting: Clinical

## 2023-10-05 DIAGNOSIS — F331 Major depressive disorder, recurrent, moderate: Secondary | ICD-10-CM

## 2023-10-05 NOTE — Progress Notes (Signed)
   THERAPIST PROGRESS NOTE Virtual Visit via Video Note  I connected with Katrina Shaw on 10/05/2023 at 10:00 AM EST by a video enabled telemedicine application and verified that I am speaking with the correct person using two identifiers.  Location: Patient: home Provider: office   I discussed the limitations of evaluation and management by telemedicine and the availability of in person appointments. The patient expressed understanding and agreed to proceed.   Follow Up Instructions: I discussed the assessment and treatment plan with the patient. The patient was provided an opportunity to ask questions and all were answered. The patient agreed with the plan and demonstrated an understanding of the instructions.   The patient was advised to call back or seek an in-person evaluation if the symptoms worsen or if the condition fails to improve as anticipated.   Session Time: 20 minutes  Participation Level: Active  Behavioral Response: CasualAlertAnxious  Type of Therapy: Individual Therapy  Treatment Goals addressed:  [place] will practice behavioral activation skills 3 times per week for the next 12 weeks   ProgressTowards Goals: Progressing  Interventions: CBT  Summary:  Katrina Shaw is a 23 y.o. female who presents for the scheduled appointment oriented times five, appropriately dressed and friendly. Client denied hallucinations and delusions. Client reported she has been managing fairly okay but her depressive symptoms have not been well.  Client reported she stopped taking her psychiatric meds because she ran out about 2 weeks ago.  Client reported overall she feels like the regimen that was prescribed has not been effective for the management of her symptoms.  Client reported she has had a lot on her mind as well concerning politics and the new presidency.  Client reported her friend texted her and told her that she saw a ICE Katrina Shaw this morning. Client reported  otherwise she is doing okay. Evidence of progress towards goal:  client reported 1 positive of practicing grounding skills and routine to help her anxiety and depression.   Suicidal/Homicidal: Nowithout intent/plan  Therapist Response:  Therapist began the appointment asking the client how she has been doing. Therapist used cbt to engage using active listening and positive emotional support. Therapist used cbt to engage and give the client time to discuss her thoughts and feelings about stressors. Therapist used cbt to normalize her emotions. Therapist used CBT ask the client to identify her progress with frequency of use with coping skills with continued practice in her daily activity.    Therapist assigned the client homework to practice self care.   Plan: Return again in 4 weeks.  Diagnosis: major depressive disorder, recurrent episode, moderate  Collaboration of Care: Patient refused AEB none requested by the client.  Patient/Guardian was advised Release of Information must be obtained prior to any record release in order to collaborate their care with an outside provider. Patient/Guardian was advised if they have not already done so to contact the registration department to sign all necessary forms in order for [place] to release information regarding their care.   Consent: Patient/Guardian gives verbal consent for treatment and assignment of benefits for services provided during this visit. Patient/Guardian expressed understanding and agreed to proceed.   Neena Rhymes Dreden Rivere, LCSW 10/05/2023

## 2023-10-12 ENCOUNTER — Encounter (HOSPITAL_COMMUNITY): Payer: Medicaid Other | Admitting: Student

## 2023-10-12 ENCOUNTER — Telehealth (HOSPITAL_COMMUNITY): Payer: Medicaid Other | Admitting: Student

## 2023-10-12 NOTE — Progress Notes (Signed)
This encounter was created in error - please disregard. Unable to see patient on video call. No answer on cell phone. LVM.

## 2023-10-26 ENCOUNTER — Ambulatory Visit (HOSPITAL_COMMUNITY): Payer: Medicaid Other | Admitting: Clinical

## 2023-10-26 ENCOUNTER — Encounter (HOSPITAL_COMMUNITY): Payer: Self-pay

## 2024-02-21 LAB — HCV ANTIBODY: HCV Ab: NONREACTIVE

## 2024-05-11 LAB — HIV ANTIBODY: HIV: NONREACTIVE

## 2024-06-05 ENCOUNTER — Encounter: Payer: Self-pay | Admitting: Physician Assistant

## 2024-06-05 ENCOUNTER — Ambulatory Visit: Admitting: Physician Assistant

## 2024-06-05 VITALS — BP 121/83 | HR 63 | Ht 59.0 in | Wt 154.0 lb

## 2024-06-05 DIAGNOSIS — N921 Excessive and frequent menstruation with irregular cycle: Secondary | ICD-10-CM

## 2024-06-05 DIAGNOSIS — Z975 Presence of (intrauterine) contraceptive device: Secondary | ICD-10-CM | POA: Diagnosis not present

## 2024-06-05 MED ORDER — IBUPROFEN 600 MG PO TABS: ORAL_TABLET | ORAL | 1 refills | Status: AC

## 2024-06-05 NOTE — Progress Notes (Signed)
 Pt had physical 05/11/24 according to notes. She currently has nexplanon placed, but is wishing for a more permanent mode of BC. Pt reports last PAP was a year ago.

## 2024-06-05 NOTE — Progress Notes (Signed)
 GYNECOLOGY  VISIT   HPI: Katrina Shaw is a 22 y.o.  single female here for contraception counseling. Currently using nexplanon, now dissatisfied with frequent breakthrough bleeding. When nexplanon is due to be removed, she would like to consider more long-term contraceptive option. She's interested in IUD vs. tubal ligation.   GYNECOLOGIC HISTORY: No LMP recorded. Patient has had an implant. Contraception: nexplanon Last mammogram:  Never previously done due to age        OB History     Gravida  0   Para  0   Term  0   Preterm  0   AB  0   Living  0      SAB  0   IAB  0   Ectopic  0   Multiple  0   Live Births  0              Patient Active Problem List   Diagnosis Date Noted   Major depressive disorder, recurrent episode, moderate (HCC) 06/21/2023   Generalized anxiety disorder 06/21/2023   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    History reviewed. No pertinent past medical history.  Past Surgical History:  Procedure Laterality Date   WISDOM TOOTH EXTRACTION      Current Outpatient Medications  Medication Sig Dispense Refill   valACYclovir (VALTREX) 500 MG tablet Take 500 mg by mouth 2 (two) times daily.     Vitamin D, Ergocalciferol, (DRISDOL) 1.25 MG (50000 UNIT) CAPS capsule Take 50,000 Units by mouth every 7 (seven) days.     hydrocortisone  (ANUSOL -HC) 2.5 % rectal cream Place 1 Application rectally 2 (two) times daily. (Patient not taking: Reported on 06/05/2024) 30 g 0   vortioxetine  HBr (TRINTELLIX ) 10 MG TABS tablet Take 1 tablet (10 mg total) by mouth daily. (Patient not taking: Reported on 06/05/2024) 30 tablet 2   No current facility-administered medications for this visit.     ALLERGIES: Patient has no known allergies.  History reviewed. No pertinent family history.  Social History   Socioeconomic History   Marital status: Single    Spouse name: Not on file   Number of children: Not on file   Years of education: Not on  file   Highest education level: Not on file  Occupational History   Not on file  Tobacco Use   Smoking status: Some Days    Types: Cigarettes   Smokeless tobacco: Never  Vaping Use   Vaping status: Some Days  Substance and Sexual Activity   Alcohol use: Yes    Comment: socially   Drug use: Not Currently   Sexual activity: Not Currently    Birth control/protection: Injection  Other Topics Concern   Not on file  Social History Narrative   Not on file   Social Drivers of Health   Financial Resource Strain: Not at Risk (05/12/2023)   Received from [employer] Strain    How hard is it for you to pay for the very basics like food, housing, heating, medical care, and medications?: 1  Food Insecurity: Not at Risk (05/12/2023)   Received from [facility] Insecurity    Within the past 12 months, you worried that your food would run out before you got money to buy more.: 1  Transportation Needs: Not at Risk (05/12/2023)   Received from [facility] Needs    In the past 12 months, has lack of transportation kept you from medical appointments, meetings, work or from  getting things needed for daily living?: 1  Physical Activity: Not on File (01/01/2022)   Received from [facility]   Physical Activity    Physical Activity: 0  Stress: Not on File (01/01/2022)   Received from [facility]   Stress    Stress: 0  Social Connections: Not on File (05/18/2023)   Received from [facility]   Social Connections    Connectedness: 0  Intimate Partner Violence: Unknown (09/30/2022)   Received from [facility]   HITS    Physically Hurt: Not on file    Insult or Talk Down To: Not on file    Threaten Physical Harm: Not on file    Scream or Curse: Not on file    Review of Systems  PHYSICAL EXAMINATION:    BP 121/83   Pulse 63   Ht 4' 11 (1.499 m)   Wt 154 lb (69.9 kg)   BMI 31.10 kg/m     General appearance: alert, cooperative and appears stated age Head: Normocephalic, without  obvious abnormality, atraumatic Neck: Symmetrical, trachea midline and thyroid normal to inspection Lungs: normal respiratory effort Heart: regular rate  Extremities: extremities normal, atraumatic, no cyanosis or edema Skin: Skin color, texture, turgor normal. No rashes or lesions Neurologic: Grossly normal   ASSESSMENT & PLAN  1. Breakthrough bleeding on Nexplanon (Primary) -Trial NSAIDs for breakthrough bleeding -Discussed more long-term options for contraception including IUD and tubal ligation -Will meet with MD for surgical consult before nexplanon removal - ibuprofen  (ADVIL ) 600 MG tablet; Take 1 (one) tablet by mouth three times daily on days of breakthrough bleeding  Dispense: 30 tablet; Refill: 1   An After Visit Summary was printed and given to the patient.  Delyla Sandeen E Jorrell Kuster, PA-C 9/22/20257:56 PM

## 2024-06-05 NOTE — Patient Instructions (Addendum)
 Take one tablet of Ibuprofen  600 mg three times daily for 5-10 days to stop breakthrough bleeding. You may come back sooner if this does not work.

## 2024-06-05 NOTE — Progress Notes (Signed)
 NGYN presents for [facility] consult. Pt currently has 2nd Nexplanon. Pt c/o of multiple periods a month since 01/2024. Due for removal 09/2023. Looking for more permanent options

## 2024-06-19 ENCOUNTER — Ambulatory Visit: Admitting: Obstetrics and Gynecology

## 2024-07-04 ENCOUNTER — Ambulatory Visit: Admitting: Obstetrics and Gynecology

## 2024-07-28 ENCOUNTER — Ambulatory Visit: Admitting: Physician Assistant

## 2024-08-02 ENCOUNTER — Ambulatory Visit: Admitting: Obstetrics and Gynecology

## 2024-08-21 ENCOUNTER — Ambulatory Visit (INDEPENDENT_AMBULATORY_CARE_PROVIDER_SITE_OTHER): Admitting: Obstetrics and Gynecology

## 2024-08-21 VITALS — BP 134/89 | HR 79 | Ht 59.0 in | Wt 152.5 lb

## 2024-08-21 DIAGNOSIS — Z3046 Encounter for surveillance of implantable subdermal contraceptive: Secondary | ICD-10-CM

## 2024-08-21 DIAGNOSIS — Z3009 Encounter for other general counseling and advice on contraception: Secondary | ICD-10-CM | POA: Diagnosis not present

## 2024-08-21 NOTE — Progress Notes (Signed)
   RETURN GYNECOLOGY VISIT  Subjective:  Katrina Shaw is a 23 y.o. G0P0000 with Nexplanon in place presenting for nexplanon removal and discussion of sterilization  Nexplanon in x 3 years. Has had breakthrough bleeding with this one that is bothersome so would like it removed. Not currently sexually active.   Is interested in permanent sterilization. Does not want children in the future. Does not want to deal with side effects of nexplanon  Objective:   Vitals:   08/21/24 0839  BP: 134/89  Pulse: 79  Weight: 152 lb 8 oz (69.2 kg)  Height: 4' 11 (1.499 m)   General:  Alert, oriented and cooperative. Patient is in no acute distress.  Skin: Skin is warm and dry. No rash noted.   Cardiovascular: Normal heart rate noted  Respiratory: Normal respiratory effort, no problems with respiration noted   Assessment and Plan:  Katrina Shaw is a 23 y.o. here for sterilization consult & nexplanon removal  Sterilization consult - She desires permanent sterilization. Discussed alternatives including LARC options - would recommend these due to lower risk  - Discussed surgery of laparoscopic salpingectomy vs tubal ligation - Risks of surgery include but are not limited to: bleeding, infection, injury to surrounding organs/tissues (i.e. bowel/bladder/ureters), need for additional procedures, wound complications, hospital re-admission, and conversion to open surgery, VTE. We reviewed risk of contraceptive failure and risk of regret.  - She is considering her options  2. Nexplanon removal Uncomplicated, see separate procedure note  Return in about 6 months (around 02/19/2025) for annual exam or sooner as needed.  Kieth JAYSON Carolin, MD

## 2024-08-21 NOTE — Progress Notes (Signed)
 Pt presents for Nexplanon removal. Pt has no questions or concerns at this time.

## 2024-08-21 NOTE — Progress Notes (Signed)
   GYNECOLOGY OFFICE PROCEDURE NOTE  Katrina Shaw is a 23 y.o. G0P0000 here for Nexplanon removal. Pap NILM 12/07/22  Nexplanon Removal Patient identified, informed consent performed, consent signed.   Appropriate time out taken.   Nexplanon site identified.  Area prepped in usual sterile fashon. One ml of 1% lidocaine was used to anesthetize the area at the distal end of the implant. A small stab incision was made right beside the implant on the distal portion.  The Nexplanon rod was removed completely using pop out technique.  There was minimal blood loss. There were no complications.  Steri-strips were applied over the small incision.  A pressure bandage was applied to reduce any bruising.    The patient tolerated the procedure well and was given post procedure instructions.  Patient is planning abstinence.   Kieth Carolin, MD Obstetrician & Gynecologist, [facility] for [employer], [facility]

## 2024-10-12 ENCOUNTER — Other Ambulatory Visit: Payer: Self-pay

## 2024-10-12 ENCOUNTER — Ambulatory Visit: Admission: EM | Admit: 2024-10-12 | Discharge: 2024-10-12 | Disposition: A | Discharge status: Home / Self Care

## 2024-10-12 ENCOUNTER — Encounter: Payer: Self-pay | Admitting: *Deleted

## 2024-10-12 DIAGNOSIS — R0789 Other chest pain: Secondary | ICD-10-CM | POA: Diagnosis not present

## 2024-10-12 HISTORY — DX: Bipolar disorder, unspecified: F31.9

## 2024-10-12 NOTE — ED Provider Notes (Signed)
 " [facility]    CSN: [id number] Arrival date & time: 10/12/24  1128      History   Chief Complaint Chief Complaint  Patient presents with   Tingling    chest    HPI Katrina Shaw is a 24 y.o. female.   Pt presents today due to 4 days of tingling and aching of left side of chest that goes down left arm. Pt denies dizziness, nausea, blurred vision, shortness of breath, recent long travel, or chest pain. Pt does not appear in acute distress during interview. Pt reports that she is a smoker and has a sedentary lifestyle. Pt was concerned that she may be having a stroke. Pt states that she has been taking ibuprofen  for symptoms.   The history is provided by the patient.    Past Medical History:  Diagnosis Date   Bipolar depression [facility])     Patient Active Problem List   Diagnosis Date Noted   Major depressive disorder, recurrent episode, moderate (HCC) 06/21/2023   Generalized anxiety disorder 06/21/2023   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    Past Surgical History:  Procedure Laterality Date   WISDOM TOOTH EXTRACTION      OB History     Gravida  0   Para  0   Term  0   Preterm  0   AB  0   Living  0      SAB  0   IAB  0   Ectopic  0   Multiple  0   Live Births  0            Home Medications    Prior to Admission medications  Medication Sig Start Date End Date Taking? Authorizing Provider  valACYclovir (VALTREX) 500 MG tablet Take 500 mg by mouth 2 (two) times daily.   Yes [provider]  fluconazole (DIFLUCAN) 150 MG tablet Take 150 mg by mouth once. Patient not taking: Reported on 10/12/2024 12/14/22   [provider]  hydrocortisone  (ANUSOL -HC) 2.5 % rectal cream Place 1 Application rectally 2 (two) times daily. Patient not taking: Reported on 08/21/2024 04/08/23   Billy Asberry FALCON, PA-C  ibuprofen  (ADVIL ) 600 MG tablet Take 1 (one) tablet by mouth three times daily on days of breakthrough  bleeding Patient not taking: Reported on 08/21/2024 06/05/24   Davis, Devon E, PA-C  ketoconazole (NIZORAL) 2 % cream Apply 1 Application topically 2 (two) times daily. Patient not taking: Reported on 10/12/2024 11/27/22   [provider]  traZODone  (DESYREL ) 50 MG tablet Take 50 mg by mouth at bedtime. Patient not taking: Reported on 10/12/2024    [provider]  Vitamin D, Ergocalciferol, (DRISDOL) 1.25 MG (50000 UNIT) CAPS capsule Take 50,000 Units by mouth every 7 (seven) days. Patient not taking: Reported on 08/21/2024    [provider]  vortioxetine  HBr (TRINTELLIX ) 10 MG TABS tablet Take 1 tablet (10 mg total) by mouth daily. Patient not taking: Reported on 08/21/2024 08/10/23   Marry Clamp, MD    Family History No family history on file.  Social History Social History[1]   Allergies   Patient has no known allergies.   Review of Systems Review of Systems   Physical Exam Triage Vital Signs ED Triage Vitals  Encounter Vitals Group     BP 10/12/24 1403 (!) 164/122     Girls Systolic BP Percentile --      Girls Diastolic BP Percentile --      Boys Systolic  BP Percentile --      Boys Diastolic BP Percentile --      Pulse Rate 10/12/24 1403 76     Resp 10/12/24 1403 18     Temp 10/12/24 1403 99.1 F (37.3 C)     Temp Source 10/12/24 1403 Oral     SpO2 10/12/24 1403 97 %     Weight --      Height --      Head Circumference --      Peak Flow --      Pain Score 10/12/24 1359 0     Pain Loc --      Pain Education --      Exclude from Growth Chart --    No data found.  Updated Vital Signs BP (!) 155/117 (BP Location: Left Arm)   Pulse 76   Temp 99.1 F (37.3 C) (Oral)   Resp 18   LMP 10/08/2024   SpO2 97%   Visual Acuity Right Eye Distance:   Left Eye Distance:   Bilateral Distance:    Right Eye Near:   Left Eye Near:    Bilateral Near:     Physical Exam Vitals and nursing note reviewed.  Constitutional:      General: She  is not in acute distress.    Appearance: Normal appearance. She is not ill-appearing, toxic-appearing or diaphoretic.  Eyes:     General: No scleral icterus. Cardiovascular:     Rate and Rhythm: Normal rate and regular rhythm.     Heart sounds: Normal heart sounds.  Pulmonary:     Effort: Pulmonary effort is normal. No respiratory distress.     Breath sounds: Normal breath sounds. No wheezing or rhonchi.  Chest:     Chest wall: Tenderness present.     Comments: Chest pain is reproducible to palpation and active ROM  Skin:    General: Skin is warm.  Neurological:     Mental Status: She is alert and oriented to person, place, and time.  Psychiatric:        Mood and Affect: Mood normal.        Behavior: Behavior normal.      UC Treatments / Results  Labs (all labs ordered are listed, but only abnormal results are displayed) Labs Reviewed - No data to display  EKG   Radiology No results found.  Procedures Procedures (including critical care time)  Medications Ordered in UC Medications - No data to display  Initial Impression / Assessment and Plan / UC Course  I have reviewed the triage vital signs and the nursing notes.  Pertinent labs & imaging results that were available during my care of the patient were reviewed by me and considered in my medical decision making (see chart for details).  Clinical Course as of 10/12/24 1436  Thu Oct 12, 2024  1429 155/117 [SP]    Clinical Course User Index [SP] Andra Corean BROCKS, PA-C     Final Clinical Impressions(s) / UC Diagnoses   Final diagnoses:  Musculoskeletal chest pain     Discharge Instructions      Your chest pain is musculoskeletal, I do not believe that you are experiencing a pulmonary or cardiac problem. You may use ibuprofen  600-800 mg every 8 hrs as needed for pain, you may also apply ice to affected areas for 20 mins at a time.   If you experience blurred or double vision, weakness on one side  of body, slurred speech, or are unsteady  when walking these are signs of stroke and you should go to the ED immediately, preferably get help within 90 mins of symptoms starting  Please stop smoking, speak with your PCP about ways to quit when you are ready.      ED Prescriptions   None    PDMP not reviewed this encounter.    [1]  Social History Tobacco Use   Smoking status: Some Days    Types: Cigarettes   Smokeless tobacco: Never  Vaping Use   Vaping status: Some Days  Substance Use Topics   Alcohol use: Yes    Comment: socially   Drug use: Not Currently     Andra Corean JAYSON DEVONNA 10/12/24 1445  "

## 2024-10-12 NOTE — ED Triage Notes (Addendum)
 Pt reports chest tingling, mid chest and left shoulder since Monday (4 days ago). States it is achy at times but denies pain at present. States she is not short of breath. Denies cough. Her period started the same day. She has been taking ibuprofen .

## 2024-10-12 NOTE — Discharge Instructions (Signed)
 Your chest pain is musculoskeletal, I do not believe that you are experiencing a pulmonary or cardiac problem. You may use ibuprofen  600-800 mg every 8 hrs as needed for pain, you may also apply ice to affected areas for 20 mins at a time.   If you experience blurred or double vision, weakness on one side of body, slurred speech, or are unsteady when walking these are signs of stroke and you should go to the ED immediately, preferably get help within 90 mins of symptoms starting  Please stop smoking, speak with your PCP about ways to quit when you are ready.
